# Patient Record
Sex: Female | Born: 1945 | Race: White | Hispanic: No | Marital: Married | Smoking: Never smoker
Health system: Southern US, Community
[De-identification: ages and names within clinical notes are randomized; demographics above are authoritative.]

## PROBLEM LIST (undated history)

## (undated) DIAGNOSIS — M47816 Spondylosis without myelopathy or radiculopathy, lumbar region: Secondary | ICD-10-CM

## (undated) DIAGNOSIS — M8580 Other specified disorders of bone density and structure, unspecified site: Secondary | ICD-10-CM

## (undated) DIAGNOSIS — K315 Obstruction of duodenum: Secondary | ICD-10-CM

## (undated) DIAGNOSIS — Z96652 Presence of left artificial knee joint: Secondary | ICD-10-CM

## (undated) DIAGNOSIS — K219 Gastro-esophageal reflux disease without esophagitis: Secondary | ICD-10-CM

## (undated) DIAGNOSIS — M810 Age-related osteoporosis without current pathological fracture: Secondary | ICD-10-CM

## (undated) DIAGNOSIS — M5135 Other intervertebral disc degeneration, thoracolumbar region: Secondary | ICD-10-CM

## (undated) HISTORY — PX: ORIF DISTAL RADIUS FRACTURE: SUR927

## (undated) HISTORY — PX: ABDOMINAL HYSTERECTOMY: SHX81

## (undated) HISTORY — PX: APPENDECTOMY: SHX54

## (undated) HISTORY — PX: TONSILLECTOMY: SUR1361

---

## 2003-04-06 ENCOUNTER — Other Ambulatory Visit: Payer: Self-pay

## 2005-01-27 ENCOUNTER — Emergency Department: Payer: Self-pay | Admitting: Emergency Medicine

## 2005-07-31 ENCOUNTER — Emergency Department: Payer: Self-pay | Admitting: Unknown Physician Specialty

## 2005-08-01 ENCOUNTER — Other Ambulatory Visit: Payer: Self-pay

## 2005-10-06 ENCOUNTER — Emergency Department: Payer: Self-pay | Admitting: Emergency Medicine

## 2007-10-24 ENCOUNTER — Other Ambulatory Visit: Payer: Self-pay

## 2007-10-24 ENCOUNTER — Emergency Department: Payer: Self-pay | Admitting: Emergency Medicine

## 2012-05-07 DIAGNOSIS — K315 Obstruction of duodenum: Secondary | ICD-10-CM

## 2012-05-07 HISTORY — DX: Obstruction of duodenum: K31.5

## 2012-08-08 HISTORY — PX: ESOPHAGOGASTRODUODENOSCOPY: SHX1529

## 2012-08-27 HISTORY — PX: ESOPHAGOGASTRODUODENOSCOPY: SHX1529

## 2012-09-03 HISTORY — PX: ESOPHAGOGASTRODUODENOSCOPY: SHX1529

## 2012-09-10 HISTORY — PX: ESOPHAGOGASTRODUODENOSCOPY: SHX1529

## 2012-09-17 HISTORY — PX: ESOPHAGOGASTRODUODENOSCOPY: SHX1529

## 2012-10-08 HISTORY — PX: ESOPHAGOGASTRODUODENOSCOPY: SHX1529

## 2013-09-20 ENCOUNTER — Emergency Department: Payer: Self-pay | Admitting: Internal Medicine

## 2013-09-22 ENCOUNTER — Ambulatory Visit: Payer: Self-pay | Admitting: Orthopedic Surgery

## 2013-12-29 ENCOUNTER — Ambulatory Visit: Payer: Self-pay | Admitting: Orthopedic Surgery

## 2014-08-28 NOTE — Op Note (Signed)
PATIENT NAME:  Vanessa Barrett, Vanessa B MR#:  [id number] DATE OF BIRTH:  1946-05-04  DATE OF PROCEDURE:  12/29/2013  PREOPERATIVE DIAGNOSIS: Unstable right distal radioulnar joint with prior open reduction and internal fixation of the wrist.   POSTOPERATIVE DIAGNOSIS: Unstable right distal radioulnar joint with prior open reduction and internal fixation of the wrist.   PROCEDURE: Right distal radius hardware removal, Sauve Kapandji procedure right wrist.   ANESTHESIA: General.   SURGEON: Leitha SchullerMichael J. Harrietta Incorvaia, M.D.   DESCRIPTION OF PROCEDURE: The patient was brought to the operating room, and after adequate anesthesia was obtained, appropriate patient identification and timeout procedures were completed with a tourniquet applied to the upper arm. The prior scar over the FCR tendon was somewhat hypertrophic and was elliptically excised after raising the tourniquet. The FCR tendon was identified. The tendon sheath incised and the tendon retracted radially to protect the radial artery and associated veins. The plate was then exposed without difficulty and all hardware was removed. The fracture was healed. This wound was irrigated and closed with 3-0 Vicryl subcutaneously and 4-0 nylon for the skin.   The wrist was then examined under fluoroscopy, and with the wrist in supination, the ulnar head was in a reduced position. An ulnar approach was made, direct ulnar approach exposing the distal ulna, and a guidewire for the 4.0 cannulated screw was placed fixing the DRUJ in a stable position. Additionally, prior to placing this pin, the DRUJ was debrided with the use of a rongeur from the dorsal side. After this was pinned in place, measuring from the screw location, an approximately 13 mm resection of the distal ulna was created and the bone removed in an extraperiosteal fashion. The screw was then inserted across the distal radioulnar joint drilling and placing a 34 mm 4.0 cannulated screw. There was full pronation and  supination after this had been completed along with the resection. The pronator was identified and sutured to the proximal shaft fragment to help stabilize this, and bone wax was applied to both ends of the bone to try to prevent bone regrowth.   The wound was thoroughly irrigated and closed with 3-0 Vicryl subcutaneously and 4-0 nylon skin suture. Xeroform, 4 x 4's, Webril and a volar splint were applied. The patient was sent to the recovery room in stable condition.   ESTIMATED BLOOD LOSS: Minimal.   COMPLICATIONS: None.   SPECIMEN: None. Hardware was sent with the patient per her request.   TOURNIQUET TIME: 54 minutes at 250 mmHg.   ____________________________ Leitha SchullerMichael J. Alyssa Mancera, MD mjm:JT D: 12/29/2013 21:45:18 ET T: 12/30/2013 07:53:50 ET JOB#: [id number]  cc: Leitha SchullerMichael J. Doral Ventrella, MD, <Dictator> Leitha SchullerMICHAEL J Reyah Streeter MD ELECTRONICALLY SIGNED 12/30/2013 8:41

## 2014-08-28 NOTE — Op Note (Signed)
PATIENT NAME:  [name], Vanessa B MR#:  [id number] DATE OF BIRTH:  [birth year]  DATE OF PROCEDURE:  09/22/2013  PREOPERATIVE DIAGNOSIS: Right distal radius fracture, displaced, without comminution.   POSTOPERATIVE DIAGNOSIS:  Right distal radius fracture, displaced, without comminution.  PROCEDURE PERFORMED:  ORIF  right distal radius right.    ANESTHESIA:  General.   SURGEON:  Kennedy BuckerMichael Cashlyn Huguley, M.D.   DESCRIPTION OF PROCEDURE:  The patient was brought to the operating room and after adequate anesthesia was obtained, the arm was prepped and draped in the usual sterile fashion. After patient identification and timeout procedures were completed, the index and middle fingers were placed in fingertrap traction and 9 pounds of traction applied. After patient identification,  timeout procedures were completed. The wrist fracture was approached through a volar approach centered over the FCR tendon. The tendon sheath was incised and the tendon was retracted radially. Deep incision carried out and then the pronator elevated off the distal fragment and shaft. With traction and volar flexion, near-anatomic reduction to be obtained. A short narrow  volar plate from then [employer] set was obtained, applied and pinned in place to be sure the appropriate position was obtained. The 2 proximal cortical screws were placed. Then with the wrist in a flexed position, the distal pegs were placed with smooth pegs being utilized; drilling, measuring and placing the smooth pegs, locking them to the plate. After the 6 holes had been filled, traction was released. The fracture was stable to range of motion. It was essentially in anatomic alignment. Through oblique views, there was no penetration into the joint. The wound was irrigated and tourniquet let down. The wound was closed with 3-0 Vicryl subcutaneously and 4-0 nylon for the skin in a simple interrupted manner. Xeroform, 4 x 4's, Webril and a volar splint were applied,  along with an Ace wrap. Tourniquet time was 22 minutes at 250 mmHg.  There were no complications. No specimen.   IMPLANT: [employer], right short, narrow DVR plate with screws and smooth pegs.    ____________________________ Leitha SchullerMichael J. Lesly Pontarelli, MD mjm:dmm D: 09/22/2013 22:04:40 ET T: 09/22/2013 22:16:01 ET JOB#: [id number]  cc: Leitha SchullerMichael J. Ellinor Test, MD, <Dictator> Leitha SchullerMICHAEL J Elly Haffey MD ELECTRONICALLY SIGNED 09/23/2013 10:11

## 2016-01-16 ENCOUNTER — Other Ambulatory Visit: Payer: Self-pay | Admitting: Family Medicine

## 2016-01-16 DIAGNOSIS — Z1231 Encounter for screening mammogram for malignant neoplasm of breast: Secondary | ICD-10-CM

## 2016-02-03 ENCOUNTER — Ambulatory Visit: Payer: Self-pay

## 2016-02-13 ENCOUNTER — Encounter: Payer: Self-pay | Admitting: Radiology

## 2016-02-13 ENCOUNTER — Ambulatory Visit
Admission: RE | Admit: 2016-02-13 | Discharge: 2016-02-13 | Disposition: A | Discharge status: Home / Self Care | Payer: Medicare HMO | Source: Ambulatory Visit | Attending: Family Medicine | Admitting: Family Medicine

## 2016-02-13 DIAGNOSIS — Z1231 Encounter for screening mammogram for malignant neoplasm of breast: Secondary | ICD-10-CM

## 2016-03-07 HISTORY — PX: KNEE ARTHROPLASTY: SHX992

## 2017-04-03 ENCOUNTER — Other Ambulatory Visit: Payer: Self-pay | Admitting: Family Medicine

## 2017-04-03 DIAGNOSIS — Z1231 Encounter for screening mammogram for malignant neoplasm of breast: Secondary | ICD-10-CM

## 2017-05-03 ENCOUNTER — Ambulatory Visit
Admission: RE | Admit: 2017-05-03 | Discharge: 2017-05-03 | Disposition: A | Discharge status: Home / Self Care | Payer: Medicare HMO | Source: Ambulatory Visit | Attending: Family Medicine | Admitting: Family Medicine

## 2017-05-03 DIAGNOSIS — Z1231 Encounter for screening mammogram for malignant neoplasm of breast: Secondary | ICD-10-CM | POA: Insufficient documentation

## 2017-09-25 NOTE — Discharge Instructions (Signed)
[facility] °[facility] °ENDOSCOPIC SINUS SURGERY °[facility] ° °What is Functional Endoscopic Sinus Surgery? ° The Surgery involves making the natural openings of the sinuses larger by removing the bony partitions that separate the sinuses from the nasal cavity.  The natural sinus lining is preserved as much as possible to allow the sinuses to resume normal function after the surgery.  In some patients nasal polyps (excessively swollen lining of the sinuses) may be removed to relieve obstruction of the sinus openings.  The surgery is performed through the nose using lighted scopes, which eliminates the need for incisions on the face.  A septoplasty is a different procedure which is sometimes performed with sinus surgery.  It involves straightening the boy partition that separates the two sides of your nose.  A crooked or deviated septum may need repair if is obstructing the sinuses or nasal airflow.  Turbinate reduction is also often performed during sinus surgery.  The turbinates are bony proturberances from the side walls of the nose which swell and can obstruct the nose in patients with sinus and allergy problems.  Their size can be surgically reduced to help relieve nasal obstruction. ° °What Can Sinus Surgery Do For Me? ° Sinus surgery can reduce the frequency of sinus infections requiring antibiotic treatment.  This can provide improvement in nasal congestion, post-nasal drainage, facial pressure and nasal obstruction.  Surgery will NOT prevent you from ever having an infection again, so it usually only for patients who get infections 4 or more times yearly requiring antibiotics, or for infections that do not clear with antibiotics.  It will not cure nasal allergies, so patients with allergies may still require medication to treat their allergies after surgery. Surgery may improve headaches related to sinusitis, however, some people will continue to  require medication to control sinus headaches related to allergies.  Surgery will do nothing for other forms of headache (migraine, tension or cluster). ° °What Are the Risks of Endoscopic Sinus Surgery? ° Current techniques allow surgery to be performed safely with little risk, however, there are rare complications that patients should be aware of.  Because the sinuses are located around the eyes, there is risk of eye injury, including blindness, though again, this would be quite rare. This is usually a result of bleeding behind the eye during surgery, which puts the vision oat risk, though there are treatments to protect the vision and prevent permanent disrupted by surgery causing a leak of the spinal fluid that surrounds the brain.  More serious complications would include bleeding inside the brain cavity or damage to the brain.  Again, all of these complications are uncommon, and spinal fluid leaks can be safely managed surgically if they occur.  The most common complication of sinus surgery is bleeding from the nose, which may require packing or cauterization of the nose.  Continued sinus have polyps may experience recurrence of the polyps requiring revision surgery.  Alterations of sense of smell or injury to the tear ducts are also rare complications.  ° °What is the Surgery Like, and what is the Recovery? ° The Surgery usually takes a couple of hours to perform, and is usually performed under a general anesthetic (completely asleep).  Patients are usually discharged home after a couple of hours.  Sometimes during surgery it is necessary to pack the nose to control bleeding, and the packing is left in place for 24 - 48 hours, and removed by your surgeon.    If a septoplasty was performed during the procedure, there is often a splint placed which must be removed after 5-7 days.   °Discomfort: Pain is usually mild to moderate, and can be controlled by prescription pain medication or acetaminophen (Tylenol).   Aspirin, Ibuprofen (Advil, Motrin), or Naprosyn (Aleve) should be avoided, as they can cause increased bleeding.  Most patients feel sinus pressure like they have a bad head cold for several days.  Sleeping with your head elevated can help reduce swelling and facial pressure, as can ice packs over the face.  A humidifier may be helpful to keep the mucous and blood from drying in the nose.  ° °Diet: There are no specific diet restrictions, however, you should generally start with clear liquids and a light diet of bland foods because the anesthetic can cause some nausea.  Advance your diet depending on how your stomach feels.  Taking your pain medication with food will often help reduce stomach upset which pain medications can cause. ° °Nasal Saline Irrigation: It is important to remove blood clots and dried mucous from the nose as it is healing.  This is done by having you irrigate the nose at least 3 - 4 times daily with a salt water solution.  We recommend using NeilMed Sinus Rinse (available at the drug store).  Fill the squeeze bottle with the solution, bend over a sink, and insert the tip of the squeeze bottle into the nose ½ of an inch.  Point the tip of the squeeze bottle towards the inside corner of the eye on the same side your irrigating.  Squeeze the bottle and gently irrigate the nose.  If you bend forward as you do this, most of the fluid will flow back out of the nose, instead of down your throat.   The solution should be warm, near body temperature, when you irrigate.   Each time you irrigate, you should use a full squeeze bottle.  ° °Note that if you are instructed to use Nasal Steroid Sprays at any time after your surgery, irrigate with saline BEFORE using the steroid spray, so you do not wash it all out of the nose. °Another product, Nasal Saline Gel (such as AYR Nasal Saline Gel) can be applied in each nostril 3 - 4 times daily to moisture the nose and reduce scabbing or crusting. ° °Bleeding:   Bloody drainage from the nose can be expected for several days, and patients are instructed to irrigate their nose frequently with salt water to help remove mucous and blood clots.  The drainage may be dark red or brown, though some fresh blood may be seen intermittently, especially after irrigation.  Do not blow you nose, as bleeding may occur. If you must sneeze, keep your mouth open to allow air to escape through your mouth. ° °If heavy bleeding occurs: Irrigate the nose with saline to rinse out clots, then spray the nose 3 - 4 times with Afrin Nasal Decongestant Spray.  The spray will constrict the blood vessels to slow bleeding.  Pinch the lower half of your nose shut to apply pressure, and lay down with your head elevated.  Ice packs over the nose may help as well. If bleeding persists despite these measures, you should notify your doctor.  Do not use the Afrin routinely to control nasal congestion after surgery, as it can result in worsening congestion and may affect healing.  ° ° ° °Activity: Return to work varies among patients. Most patients will be   out of work at least 5 - 7 days to recover.  Patient may return to work after they are off of narcotic pain medication, and feeling well enough to perform the functions of their job.  Patients must avoid heavy lifting (over 10 pounds) or strenuous physical for 2 weeks after surgery, so your employer may need to assign you to light duty, or keep you out of work longer if light duty is not possible.  NOTE: you should not drive, operate dangerous machinery, do any mentally demanding tasks or make any important legal or financial decisions while on narcotic pain medication and recovering from the general anesthetic.  °  °Call Your Doctor Immediately if You Have Any of the Following: °1. Bleeding that you cannot control with the above measures °2. Loss of vision, double vision, bulging of the eye or black eyes. °3. Fever over 101 degrees °4. Neck stiffness with  severe headache, fever, nausea and change in mental state. °You are always encourage to call anytime with concerns, however, please call with requests for pain medication refills during office hours. ° °Office Endoscopy: During follow-up visits your doctor will remove any packing or splints that may have been placed and evaluate and clean your sinuses endoscopically.  Topical anesthetic will be used to make this as comfortable as possible, though you may want to take your pain medication prior to the visit.  How often this will need to be done varies from patient to patient.  After complete recovery from the surgery, you may need follow-up endoscopy from time to time, particularly if there is concern of recurrent infection or nasal polyps. ° ° °General Anesthesia, Adult, Care After °These instructions provide you with information about caring for yourself after your procedure. Your health care provider may also give you more specific instructions. Your treatment has been planned according to current medical practices, but problems sometimes occur. Call your health care provider if you have any problems or questions after your procedure. °What can I expect after the procedure? °After the procedure, it is common to have: °· Vomiting. °· A sore throat. °· Mental slowness. ° °It is common to feel: °· Nauseous. °· Cold or shivery. °· Sleepy. °· Tired. °· Sore or achy, even in parts of your body where you did not have surgery. ° °Follow these instructions at home: °For at least 24 hours after the procedure: °· Do not: °? Participate in activities where you could fall or become injured. °? Drive. °? Use heavy machinery. °? Drink alcohol. °? Take sleeping pills or medicines that cause drowsiness. °? Make important decisions or sign legal documents. °? Take care of children on your own. °· Rest. °Eating and drinking °· If you vomit, drink water, juice, or soup when you can drink without vomiting. °· Drink enough fluid to  keep your urine clear or pale yellow. °· Make sure you have little or no nausea before eating solid foods. °· Follow the diet recommended by your health care provider. °General instructions °· Have a responsible adult stay with you until you are awake and alert. °· Return to your normal activities as told by your health care provider. Ask your health care provider what activities are safe for you. °· Take over-the-counter and prescription medicines only as told by your health care provider. °· If you smoke, do not smoke without supervision. °· Keep all follow-up visits as told by your health care provider. This is important. °Contact a health care provider if: °· You   continue to have nausea or vomiting at home, and medicines are not helpful. °· You cannot drink fluids or start eating again. °· You cannot urinate after 8-12 hours. °· You develop a skin rash. °· You have fever. °· You have increasing redness at the site of your procedure. °Get help right away if: °· You have difficulty breathing. °· You have chest pain. °· You have unexpected bleeding. °· You feel that you are having a life-threatening or urgent problem. °This information is not intended to replace advice given to you by your health care provider. Make sure you discuss any questions you have with your health care provider. °Document Released: 07/30/2000 Document Revised: 09/26/2015 Document Reviewed: 04/07/2015 °Elsevier Interactive Patient Education © 2018 [employer] ° °

## 2017-09-26 ENCOUNTER — Encounter
Admission: RE | Disposition: A | Discharge status: Home / Self Care | Payer: Self-pay | Source: Ambulatory Visit | Attending: Otolaryngology

## 2017-09-26 ENCOUNTER — Encounter: Payer: Self-pay | Admitting: Otolaryngology

## 2017-09-26 ENCOUNTER — Ambulatory Visit: Payer: Medicare HMO | Admitting: Anesthesiology

## 2017-09-26 ENCOUNTER — Ambulatory Visit
Admission: RE | Admit: 2017-09-26 | Discharge: 2017-09-26 | Disposition: A | Discharge status: Home / Self Care | Payer: Medicare HMO | Source: Ambulatory Visit | Attending: Otolaryngology | Admitting: Otolaryngology

## 2017-09-26 DIAGNOSIS — Z79899 Other long term (current) drug therapy: Secondary | ICD-10-CM | POA: Insufficient documentation

## 2017-09-26 DIAGNOSIS — Z6831 Body mass index (BMI) 31.0-31.9, adult: Secondary | ICD-10-CM | POA: Insufficient documentation

## 2017-09-26 DIAGNOSIS — J018 Other acute sinusitis: Secondary | ICD-10-CM | POA: Diagnosis not present

## 2017-09-26 DIAGNOSIS — M810 Age-related osteoporosis without current pathological fracture: Secondary | ICD-10-CM | POA: Insufficient documentation

## 2017-09-26 DIAGNOSIS — J328 Other chronic sinusitis: Secondary | ICD-10-CM | POA: Insufficient documentation

## 2017-09-26 DIAGNOSIS — E669 Obesity, unspecified: Secondary | ICD-10-CM | POA: Diagnosis not present

## 2017-09-26 DIAGNOSIS — Z7951 Long term (current) use of inhaled steroids: Secondary | ICD-10-CM | POA: Diagnosis not present

## 2017-09-26 DIAGNOSIS — J320 Chronic maxillary sinusitis: Secondary | ICD-10-CM | POA: Diagnosis present

## 2017-09-26 HISTORY — DX: Age-related osteoporosis without current pathological fracture: M81.0

## 2017-09-26 HISTORY — DX: Other specified disorders of bone density and structure, unspecified site: M85.80

## 2017-09-26 HISTORY — PX: IMAGE GUIDED SINUS SURGERY: SHX6570

## 2017-09-26 HISTORY — DX: Obstruction of duodenum: K31.5

## 2017-09-26 HISTORY — PX: SEPTOPLASTY WITH ETHMOIDECTOMY, AND MAXILLARY ANTROSTOMY: SHX6090

## 2017-09-26 HISTORY — DX: Other intervertebral disc degeneration, thoracolumbar region: M51.35

## 2017-09-26 HISTORY — DX: Presence of left artificial knee joint: Z96.652

## 2017-09-26 HISTORY — DX: Gastro-esophageal reflux disease without esophagitis: K21.9

## 2017-09-26 HISTORY — DX: Spondylosis without myelopathy or radiculopathy, lumbar region: M47.816

## 2017-09-26 SURGERY — SINUS SURGERY, WITH IMAGING GUIDANCE
Anesthesia: General | Site: Nose | Laterality: Left | Wound class: "Clean Contaminated "

## 2017-09-26 MED ORDER — ONDANSETRON HCL 4 MG/2ML IJ SOLN
INTRAMUSCULAR | Status: DC | PRN
  Administered 2017-09-26: 4 mg via INTRAVENOUS

## 2017-09-26 MED ORDER — FENTANYL CITRATE (PF) 100 MCG/2ML IJ SOLN
INTRAMUSCULAR | Status: DC | PRN
  Administered 2017-09-26: 50 ug via INTRAVENOUS

## 2017-09-26 MED ORDER — FENTANYL CITRATE (PF) 100 MCG/2ML IJ SOLN: 25.0000 ug | INTRAMUSCULAR | Status: DC | PRN

## 2017-09-26 MED ORDER — MIDAZOLAM HCL 5 MG/5ML IJ SOLN
INTRAMUSCULAR | Status: DC | PRN
  Administered 2017-09-26: 2 mg via INTRAVENOUS

## 2017-09-26 MED ORDER — PROMETHAZINE HCL 25 MG/ML IJ SOLN: 6.2500 mg | INTRAMUSCULAR | Status: DC | PRN

## 2017-09-26 MED ORDER — OXYMETAZOLINE HCL 0.05 % NA SOLN
2.0000 | Freq: Once | NASAL | Status: AC
  Administered 2017-09-26: 2 via NASAL

## 2017-09-26 MED ORDER — ROCURONIUM BROMIDE 100 MG/10ML IV SOLN
INTRAVENOUS | Status: DC | PRN
  Administered 2017-09-26: 25 mg via INTRAVENOUS

## 2017-09-26 MED ORDER — PHENYLEPHRINE HCL 0.5 % NA SOLN
NASAL | Status: DC | PRN
  Administered 2017-09-26: 30 mL

## 2017-09-26 MED ORDER — LIDOCAINE HCL (CARDIAC) PF 100 MG/5ML IV SOSY
PREFILLED_SYRINGE | INTRAVENOUS | Status: DC | PRN
  Administered 2017-09-26: 40 mg via INTRAVENOUS

## 2017-09-26 MED ORDER — ACETAMINOPHEN 10 MG/ML IV SOLN
1000.0000 mg | Freq: Once | INTRAVENOUS | Status: AC
  Administered 2017-09-26: 1000 mg via INTRAVENOUS

## 2017-09-26 MED ORDER — DEXTROSE 5 % IV SOLN
2000.0000 mg | Freq: Once | INTRAVENOUS | Status: AC
  Administered 2017-09-26: 2000 mg via INTRAVENOUS

## 2017-09-26 MED ORDER — LACTATED RINGERS IV SOLN
INTRAVENOUS | Status: DC
  Administered 2017-09-26 (×2): via INTRAVENOUS

## 2017-09-26 MED ORDER — MEPERIDINE HCL 25 MG/ML IJ SOLN: 6.2500 mg | INTRAMUSCULAR | Status: DC | PRN

## 2017-09-26 MED ORDER — PROPOFOL 10 MG/ML IV BOLUS
INTRAVENOUS | Status: DC | PRN
  Administered 2017-09-26: 150 mg via INTRAVENOUS

## 2017-09-26 MED ORDER — OXYCODONE HCL 5 MG PO TABS
5.0000 mg | ORAL_TABLET | Freq: Once | ORAL | Status: AC | PRN
  Administered 2017-09-26: 5 mg via ORAL

## 2017-09-26 MED ORDER — EPHEDRINE SULFATE 50 MG/ML IJ SOLN
INTRAMUSCULAR | Status: DC | PRN
  Administered 2017-09-26: 5 mg via INTRAVENOUS
  Administered 2017-09-26: 10 mg via INTRAVENOUS
  Administered 2017-09-26 (×2): 5 mg via INTRAVENOUS
  Administered 2017-09-26: 10 mg via INTRAVENOUS
  Administered 2017-09-26: 5 mg via INTRAVENOUS

## 2017-09-26 MED ORDER — LIDOCAINE-EPINEPHRINE 1 %-1:100000 IJ SOLN
INTRAMUSCULAR | Status: DC | PRN
  Administered 2017-09-26: 6 mL

## 2017-09-26 MED ORDER — OXYCODONE HCL 5 MG/5ML PO SOLN: 5.0000 mg | Freq: Once | ORAL | Status: AC | PRN

## 2017-09-26 MED ORDER — DEXAMETHASONE SODIUM PHOSPHATE 4 MG/ML IJ SOLN
INTRAMUSCULAR | Status: DC | PRN
  Administered 2017-09-26: 10 mg via INTRAVENOUS

## 2017-09-26 MED ORDER — GLYCOPYRROLATE 0.2 MG/ML IJ SOLN
INTRAMUSCULAR | Status: DC | PRN
  Administered 2017-09-26: .1 mg via INTRAVENOUS

## 2017-09-26 SURGICAL SUPPLY — 33 items
BALLOON SINUPLASTY SYSTEM (BALLOONS) IMPLANT
BATTERY INSTRU NAVIGATION (MISCELLANEOUS) ×9 IMPLANT
CANISTER SUCT 1200ML W/VALVE (MISCELLANEOUS) ×3 IMPLANT
CATH IV 18X1 1/4 SAFELET (CATHETERS) ×3 IMPLANT
COAGULATOR SUCT 8FR VV (MISCELLANEOUS) ×3 IMPLANT
DEVICE INFLATION SEID (MISCELLANEOUS) IMPLANT
DRAPE HEAD BAR (DRAPES) ×3 IMPLANT
ELECT REM PT RETURN 9FT ADLT (ELECTROSURGICAL) ×3
ELECTRODE REM PT RTRN 9FT ADLT (ELECTROSURGICAL) ×1 IMPLANT
GLOVE PI ULTRA LF STRL 7.5 (GLOVE) ×2 IMPLANT
GLOVE PI ULTRA NON LATEX 7.5 (GLOVE) ×6
IMPL PROPEL CONTOUR (Prosthesis and Implant ENT) IMPLANT
IMPLANT PROPEL CONTOUR (Prosthesis and Implant ENT) IMPLANT
IV CATH 18X1 1/4 SAFELET (CATHETERS) ×1
IV NS 500ML (IV SOLUTION) ×2
IV NS 500ML BAXH (IV SOLUTION) ×1 IMPLANT
KIT TURNOVER KIT A (KITS) ×3 IMPLANT
NDL ANESTHESIA 27G X 3.5 (NEEDLE) ×1 IMPLANT
NEEDLE ANESTHESIA  27G X 3.5 (NEEDLE) ×2
NEEDLE ANESTHESIA 27G X 3.5 (NEEDLE) ×1 IMPLANT
NS IRRIG 500ML POUR BTL (IV SOLUTION) ×3 IMPLANT
PACK DRAPE NASAL/ENT (PACKS) ×3 IMPLANT
PACKING NASAL EPIS 4X2.4 XEROG (MISCELLANEOUS) ×6 IMPLANT
PATTIES SURGICAL .5 X3 (DISPOSABLE) ×3 IMPLANT
SHAVER DIEGO BLD STD TYPE A (BLADE) ×2 IMPLANT
SOL ANTI-FOG 6CC FOG-OUT (MISCELLANEOUS) ×1 IMPLANT
SOL FOG-OUT ANTI-FOG 6CC (MISCELLANEOUS) ×2
STRAP BODY AND KNEE 60X3 (MISCELLANEOUS) ×3 IMPLANT
SYR 3ML LL SCALE MARK (SYRINGE) ×3 IMPLANT
TOWEL OR 17X26 4PK STRL BLUE (TOWEL DISPOSABLE) ×3 IMPLANT
TRACKER CRANIALMASK (MASK) ×3 IMPLANT
TUBING DECLOG MULTIDEBRIDER (TUBING) ×3 IMPLANT
WATER STERILE IRR 250ML POUR (IV SOLUTION) ×3 IMPLANT

## 2017-09-26 NOTE — Anesthesia Preprocedure Evaluation (Signed)
Anesthesia Evaluation  Patient identified by MRN, date of birth, ID band Patient awake    Reviewed: Allergy & Precautions, H&P , NPO status , Patient's Chart, lab work & pertinent test results, reviewed documented beta blocker date and time   Airway Mallampati: II  TM Distance: >3 FB Neck ROM: full    Dental no notable dental hx.    Pulmonary neg pulmonary ROS,    Pulmonary exam normal breath sounds clear to auscultation       Cardiovascular Exercise Tolerance: Good negative cardio ROS   Rhythm:regular Rate:Normal     Neuro/Psych negative neurological ROS  negative psych ROS   GI/Hepatic Neg liver ROS, GERD  ,Duodenal stricture 2014    Endo/Other  negative endocrine ROS  Renal/GU negative Renal ROS  negative genitourinary   Musculoskeletal   Abdominal   Peds  Hematology negative hematology ROS (+)   Anesthesia Other Findings   Reproductive/Obstetrics negative OB ROS                             Anesthesia Physical Anesthesia Plan  ASA: II  Anesthesia Plan: General ETT   Post-op Pain Management:    Induction:   PONV Risk Score and Plan:   Airway Management Planned:   Additional Equipment:   Intra-op Plan:   Post-operative Plan:   Informed Consent: I have reviewed the patients History and Physical, chart, labs and discussed the procedure including the risks, benefits and alternatives for the proposed anesthesia with the patient or authorized representative who has indicated his/her understanding and acceptance.   Dental Advisory Given  Plan Discussed with: CRNA  Anesthesia Plan Comments:         Anesthesia Quick Evaluation

## 2017-09-26 NOTE — Transfer of Care (Signed)
Immediate Anesthesia Transfer of Care Note  Patient: Vanessa Barrett  Procedure(s) Performed: IMAGE GUIDED SINUS SURGERY (Left Nose) ETHMOIDECTOMY WITH SINUS EXPLORATION, AND MAXILLARY ANTROSTOMY WITH REMOVAL OF TISSUE FROM SINUS (Left Nose)  Patient Location: PACU  Anesthesia Type: General ETT  Level of Consciousness: awake, alert  and patient cooperative  Airway and Oxygen Therapy: Patient Spontanous Breathing and Patient connected to supplemental oxygen  Post-op Assessment: Post-op Vital signs reviewed, Patient's Cardiovascular Status Stable, Respiratory Function Stable, Patent Airway and No signs of Nausea or vomiting  Post-op Vital Signs: Reviewed and stable  Complications: No apparent anesthesia complications

## 2017-09-26 NOTE — Anesthesia Postprocedure Evaluation (Signed)
Anesthesia Post Note  Patient: Vanessa Barrett  Procedure(s) Performed: IMAGE GUIDED SINUS SURGERY (Left Nose) ETHMOIDECTOMY WITH SINUS EXPLORATION, AND MAXILLARY ANTROSTOMY WITH REMOVAL OF TISSUE FROM SINUS (Left Nose)  Patient location during evaluation: PACU Anesthesia Type: General Level of consciousness: awake and alert Pain management: pain level controlled Vital Signs Assessment: post-procedure vital signs reviewed and stable Respiratory status: spontaneous breathing, nonlabored ventilation, respiratory function stable and patient connected to nasal cannula oxygen Cardiovascular status: blood pressure returned to baseline and stable Postop Assessment: no apparent nausea or vomiting Anesthetic complications: no    Sherene Plancarte ELAINE

## 2017-09-26 NOTE — Op Note (Signed)
09/26/2017  10:20 AM    Vanessa Barrett  [id number]    Pre-Op Dx: Chronic sinusitis involving the left maxillary, ethmoid, and frontal sinuses with complete opacification  Post-op Dx: Same  Proc: Left total ethmoidectomy with frontal sinus exploration, left maxillary antrostomy with removal of contents, partial reduction left inferior turbinate, use of image guided system, attempted to place propel contour stent but unable to get it to pass  Surg:  Cammy Copa  Anes:  GOT  EBL: 100 mL  Comp: None  Findings: Very inflamed tissues and the left maxillary sinus filled with creamy white fluid.  Some of the anterior ethmoids had nuchal purulence as well and lots of watery edema.  Very narrow opening into the frontal sinus duct.  I could not get a propel contour stent to slide into the hole and bend upward to sit in a proper position.    Procedure: The patient was brought to the operating room and placed in a supine position.  General anesthesia was given by oral endotracheal intubation.  The nose was prepped using cottonoid pledgets soaked in phenylephrine and Xylocaine.  1% lidocaine with epi 1: 100,000 was used for infiltration around the uncinate process and anterior middle turbinate.  A total of 3 mL was used.  The image guided system was brought in and the CT scan was downloaded to the disc.  The template was applied to the face and this was registered to the system.  There is 0.6 mm of variance.  The suction instruments were registered to the system and checked for alignment.  This appeared to be perfect alignment on both sides.  The, platelets were all removed and the 0 degrees scope was used for visualization of the areas.  The right side appeared to be healthy and clear and no surgery was done on her right side.  Left side showed very inflamed tissues in the middle meatus with mucopurulence coming from under the uncinate process.  The middle turbinate was infractured and the middle  meatus visualized.  The uncinate process was incised to open up the maxillary antrum.  The uncinate was quite inflamed and oozed a lot.  The entire uncinate process was removed.  The maxillary antrum was opened widely and the thick mucopurulence was suctioned from the left maxillary sinus.  The mucous membranes were quite inflamed throughout the sinus but there are no sign of lesions.  There is no hard chunks or crusts fungus ball noted.  The 30 degrees scope was used to visualize this area better make sure the maxillary antrum was widely open.  The natural ostium was included in this.  There is no further disease at the base of the then just inflamed mucous membranes.  The 0 degrees scope was used for visualizing the posterior ethmoids.  These were opened and cleaned out up to the skull base.  The 30 degrees scope was then used for visualization of the middle ethmoid air cells and these were cleaned out as well.  There is lots of mucosal edema and inflammation in these air cells.  The anterior ethmoid air cells were then opened and the image guided system was used throughout here for evaluating the skull base to make sure that is cleaned out completely.  The anterior ethmoids were very inflamed.  As these were opened a very small opening laterally to the frontal sinus duct was found.  This was only about 2 mm in width.  It still has its mucosa intact.  The frontal sinus curved suction was used to verify that this was going up into the frontal sinus as the rest of the area was cleaned out and there were no other openings there.  The frontal sinus probes were not long enough to get all the way up into the frontal sinus and did not angle enough.  The bony canal was very thick and I could not bite away any bone to widen up this opening.  I tried to place a propel contour stent into this to help hold it open but it would not go in and make the bend anteriorly to the frontal sinus, so I could not get it deployed into  the sinus duct.  The duct was open and mucosal line and decided that I should not traumatize it any further.  The scopes were used to visualize rest the ethmoids and make sure they were all completely cleared.  The maxillary sinus was clean as well.  The left middle turbinate was trimmed slightly along its medial border to get rid of excessive tissue.  The inferior turbinate was trimmed along some of its superior and posterior borders as well because of excessive soft tissue swelling.  It was partially reduced and electrocautery was used along some of its superior and posterior trim borders to help control bleeding.  The 0 and 30 degrees scopes were used to re-visualize all the sinuses and these were all open and clear.  The image guided system was used throughout this to make sure all the sinuses were opened.  Xerogel was then placed in the anterior ethmoid at the frontal sinus duct and into the posterior ethmoid air cells.  There was more placed at the opening of the maxillary antrum and then further Xerogel was then placed along the medial wall of the middle turbinate.  There is a good airway on this side.  There is no swelling or surgery done on the right side.  The patient tolerated the procedure well.  She was awakened and taken to recovery room in satisfactory condition.  There were no operative complications.     Dispo:   To PACU to be discharged home  Plan: To follow-up in the office in 1 week for reevaluation of sinuses.  Remain on some antibiotics at home and use some prednisone taper to help control swelling.  He will have pain medication use if needed.  Will rest with her head elevated and use saline flushes starting tomorrow.  She will let me know if she has any problems prior to the follow-up visit.  Vanessa Barrett  09/26/2017 10:20 AM

## 2017-09-26 NOTE — Anesthesia Procedure Notes (Signed)
Procedure Name: Intubation Date/Time: 09/26/2017 8:35 AM Performed by: Jimmy Picket, CRNA Pre-anesthesia Checklist: Patient identified, Emergency Drugs available, Suction available, Patient being monitored and Timeout performed Patient Re-evaluated:Patient Re-evaluated prior to induction Oxygen Delivery Method: Circle system utilized Preoxygenation: Pre-oxygenation with 100% oxygen Induction Type: IV induction Ventilation: Mask ventilation without difficulty Laryngoscope Size: Miller and 2 Grade View: Grade I Tube type: Oral Rae Tube size: 7.0 mm Number of attempts: 1 Placement Confirmation: ETT inserted through vocal cords under direct vision,  positive ETCO2 and breath sounds checked- equal and bilateral Tube secured with: Tape Dental Injury: Teeth and Oropharynx as per pre-operative assessment

## 2017-09-26 NOTE — H&P (Signed)
H&P has been reviewedand patient reevaluated,  and no changes necessary. To be downloaded later.  

## 2017-09-27 ENCOUNTER — Encounter: Payer: Self-pay | Admitting: Otolaryngology

## 2017-10-01 LAB — SURGICAL PATHOLOGY

## 2018-03-27 ENCOUNTER — Other Ambulatory Visit: Payer: Self-pay | Admitting: Family Medicine

## 2018-03-27 DIAGNOSIS — Z1231 Encounter for screening mammogram for malignant neoplasm of breast: Secondary | ICD-10-CM

## 2018-05-06 ENCOUNTER — Ambulatory Visit
Admission: RE | Admit: 2018-05-06 | Discharge: 2018-05-06 | Disposition: A | Discharge status: Home / Self Care | Payer: Medicare HMO | Source: Ambulatory Visit | Attending: Family Medicine | Admitting: Family Medicine

## 2018-05-06 DIAGNOSIS — Z1231 Encounter for screening mammogram for malignant neoplasm of breast: Secondary | ICD-10-CM | POA: Insufficient documentation

## 2019-04-14 ENCOUNTER — Other Ambulatory Visit: Payer: Self-pay | Admitting: Family Medicine

## 2019-04-14 DIAGNOSIS — Z1231 Encounter for screening mammogram for malignant neoplasm of breast: Secondary | ICD-10-CM

## 2019-05-29 ENCOUNTER — Ambulatory Visit
Admission: RE | Admit: 2019-05-29 | Discharge: 2019-05-29 | Disposition: A | Discharge status: Home / Self Care | Payer: Medicare HMO | Source: Ambulatory Visit | Attending: Family Medicine | Admitting: Family Medicine

## 2019-05-29 DIAGNOSIS — Z1231 Encounter for screening mammogram for malignant neoplasm of breast: Secondary | ICD-10-CM

## 2021-10-25 IMAGING — MG DIGITAL SCREENING BILAT W/ TOMO W/ CAD
8 series · 8 of 24 positions shown · non-contrast
Comparison: Previous exam(s).

CLINICAL DATA: Screening.

EXAM:
DIGITAL SCREENING BILATERAL MAMMOGRAM WITH TOMO AND CAD

[R MLO synth-2D]
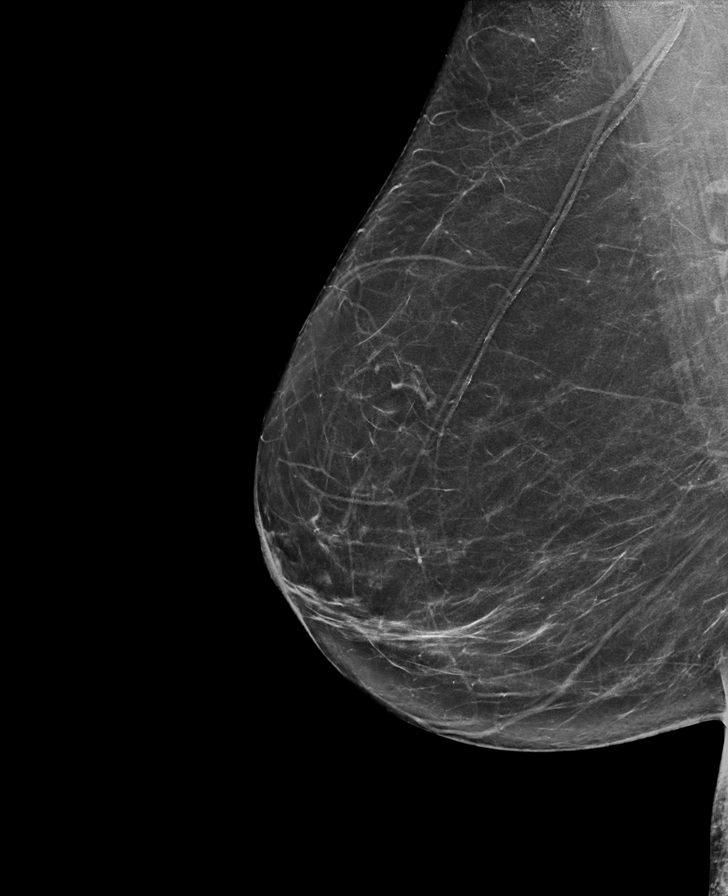

[L CC synth-2D]
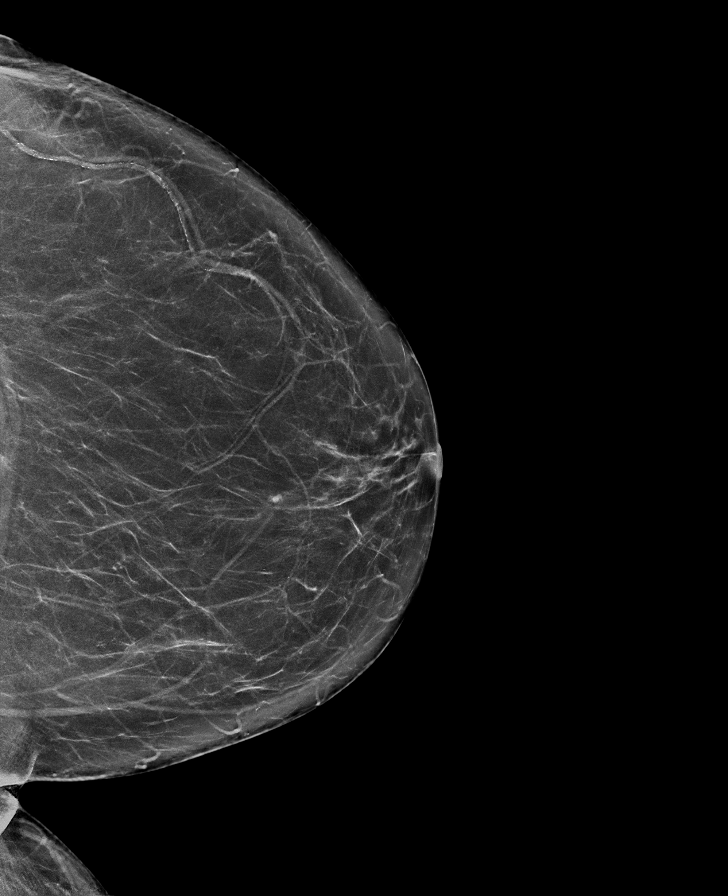

[L MLO synth-2D]
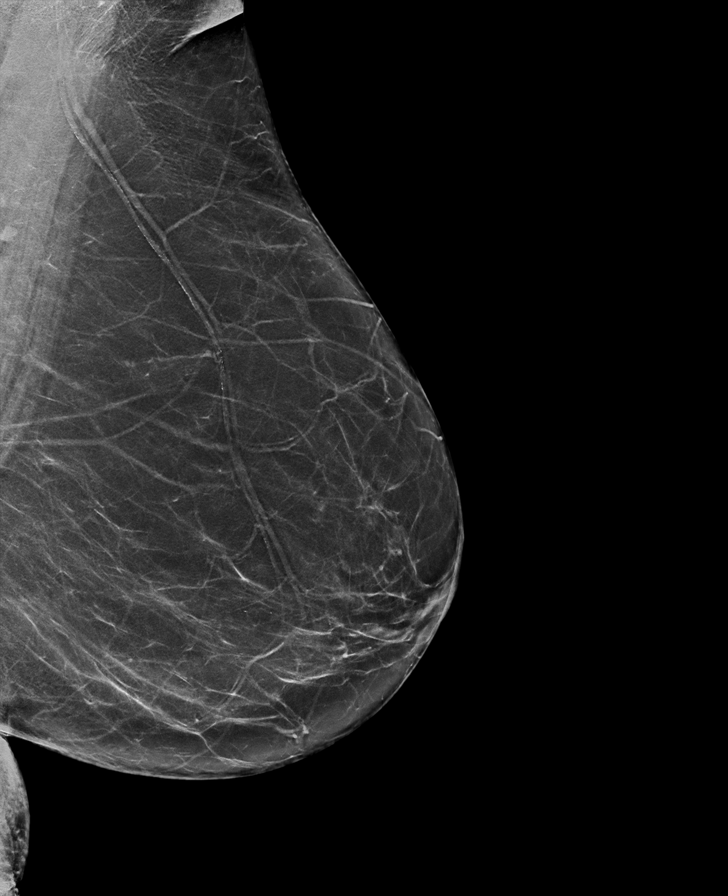

[R CC synth-2D]
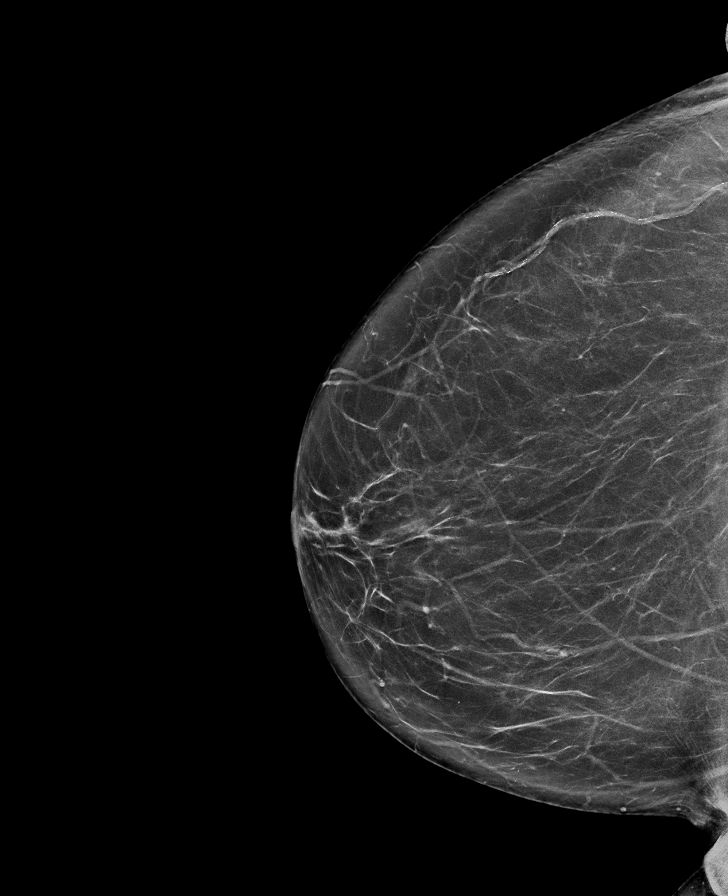

[R CC tomo · tomo slice 39/76.0]
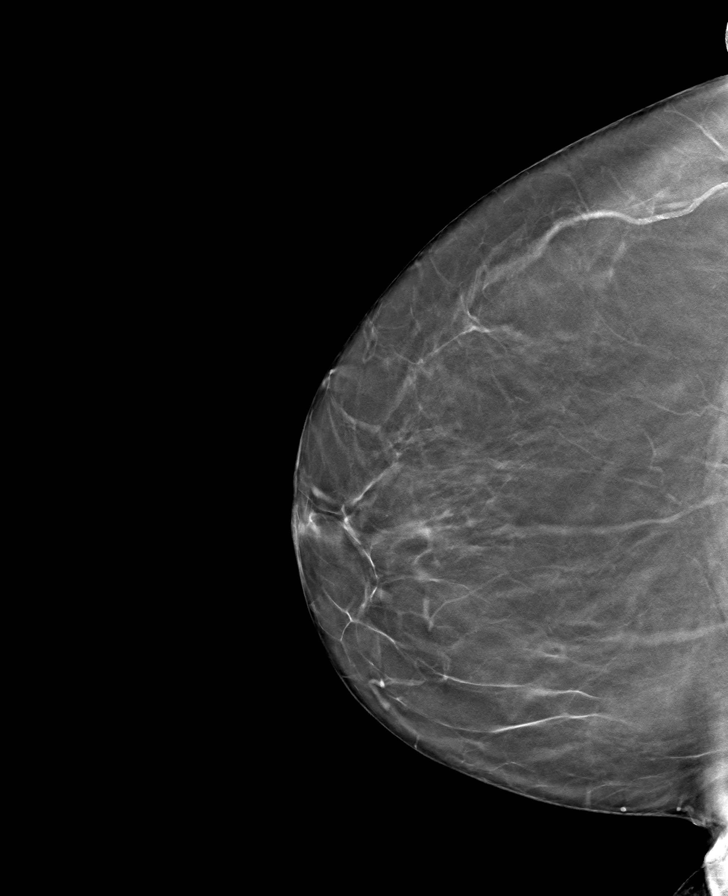

[L CC tomo · tomo slice 36/71.0]
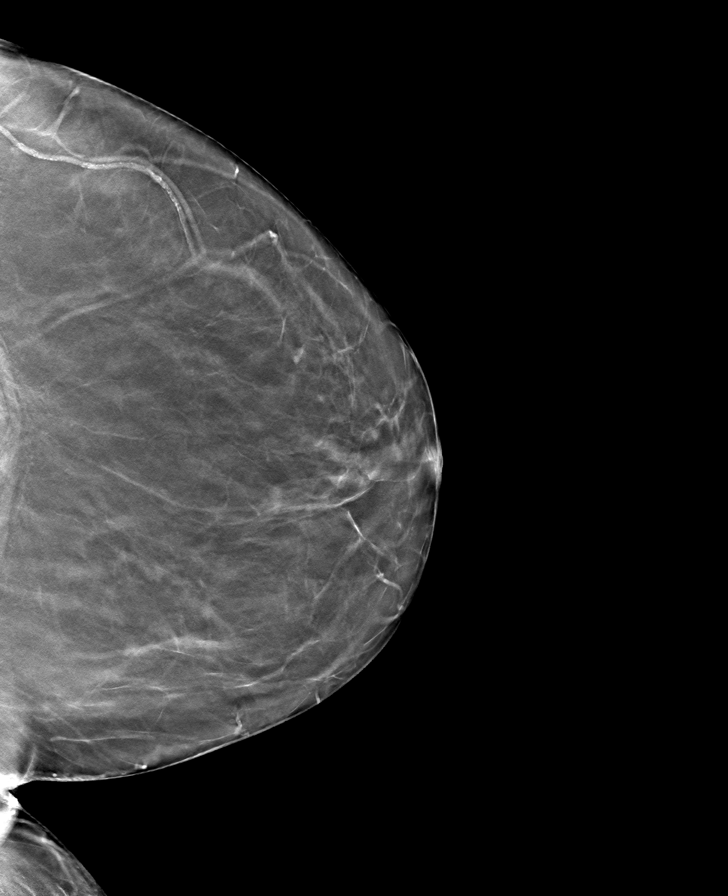

[L MLO tomo · tomo slice 36/71.0]
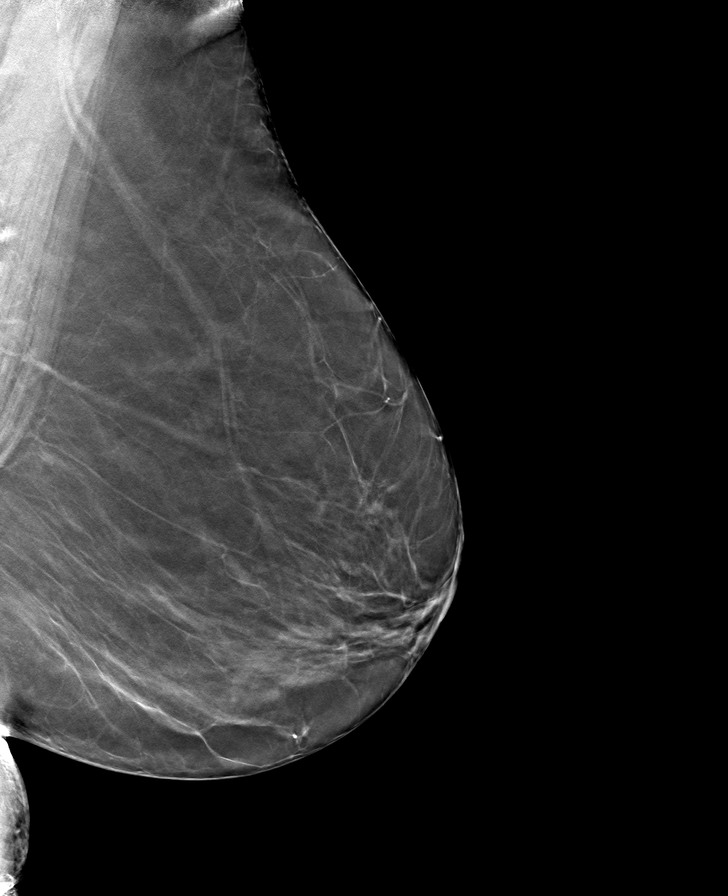

[R MLO tomo · tomo slice 37/72.0]
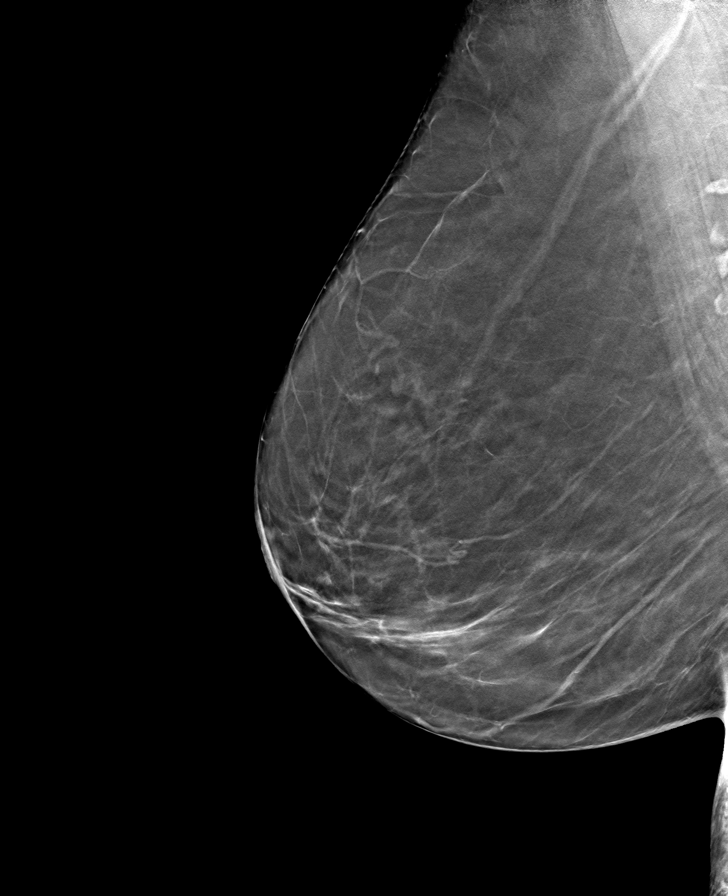

[8 of 24 positions shown; findings below may reference images not displayed]

ACR Breast Density Category b: There are scattered areas of
fibroglandular density.
FINDINGS: There are no findings suspicious for malignancy. Images were
processed with CAD.
IMPRESSION: No mammographic evidence of malignancy. A result letter of this
screening mammogram will be mailed directly to the patient.

RECOMMENDATION:
Screening mammogram in one year. (Code:[id number])

BI-RADS CATEGORY  1: Negative.

## 2022-06-19 ENCOUNTER — Other Ambulatory Visit: Payer: Self-pay | Admitting: Internal Medicine

## 2022-06-19 DIAGNOSIS — Z1231 Encounter for screening mammogram for malignant neoplasm of breast: Secondary | ICD-10-CM
